# Patient Record
Sex: Female | Born: 1970 | Race: White | Hispanic: No | Marital: Married | Smoking: Never smoker
Health system: Southern US, Community
[De-identification: ages and names within clinical notes are randomized; demographics above are authoritative.]

## PROBLEM LIST (undated history)

## (undated) DIAGNOSIS — F419 Anxiety disorder, unspecified: Secondary | ICD-10-CM

## (undated) DIAGNOSIS — F909 Attention-deficit hyperactivity disorder, unspecified type: Secondary | ICD-10-CM

---

## 2010-09-04 ENCOUNTER — Ambulatory Visit: Payer: Self-pay | Admitting: General Practice

## 2013-10-05 ENCOUNTER — Ambulatory Visit: Payer: Self-pay | Admitting: Medical

## 2013-10-14 ENCOUNTER — Ambulatory Visit: Payer: Self-pay | Admitting: Medical

## 2019-01-19 ENCOUNTER — Ambulatory Visit: Payer: Self-pay | Admitting: Obstetrics and Gynecology

## 2019-02-07 ENCOUNTER — Ambulatory Visit: Payer: Self-pay | Admitting: Obstetrics and Gynecology

## 2019-02-08 ENCOUNTER — Other Ambulatory Visit: Payer: Self-pay | Admitting: Certified Nurse Midwife

## 2019-02-08 DIAGNOSIS — Z1231 Encounter for screening mammogram for malignant neoplasm of breast: Secondary | ICD-10-CM

## 2019-02-22 ENCOUNTER — Ambulatory Visit
Admission: RE | Admit: 2019-02-22 | Discharge: 2019-02-22 | Disposition: A | Discharge status: Home / Self Care | Payer: BC Managed Care – PPO | Source: Ambulatory Visit | Attending: Certified Nurse Midwife | Admitting: Certified Nurse Midwife

## 2019-02-22 ENCOUNTER — Other Ambulatory Visit: Payer: Self-pay

## 2019-02-22 ENCOUNTER — Encounter (INDEPENDENT_AMBULATORY_CARE_PROVIDER_SITE_OTHER): Payer: Self-pay

## 2019-02-22 DIAGNOSIS — Z1231 Encounter for screening mammogram for malignant neoplasm of breast: Secondary | ICD-10-CM | POA: Diagnosis not present

## 2019-04-08 ENCOUNTER — Telehealth: Payer: BC Managed Care – PPO | Admitting: Emergency Medicine

## 2019-04-08 DIAGNOSIS — J029 Acute pharyngitis, unspecified: Secondary | ICD-10-CM | POA: Diagnosis not present

## 2019-04-08 MED ORDER — AMOXICILLIN 500 MG PO CAPS: 500.0000 mg | ORAL_CAPSULE | Freq: Three times a day (TID) | ORAL | 0 refills | Status: DC

## 2019-04-08 NOTE — Progress Notes (Signed)
We are sorry that you are not feeling well.  Here is how we plan to help!  Based on what you have shared with me it is likely that you have strep pharyngitis.  Strep pharyngitis is inflammation and infection in the back of the throat.  This is an infection cause by bacteria and is treated with antibiotics.  I have prescribed Amoxicillin 3 times a day for 10 days. For throat pain, we recommend over the counter oral pain relief medications such as acetaminophen or aspirin, or anti-inflammatory medications such as ibuprofen or naproxen sodium. Topical treatments such as oral throat lozenges or sprays may be used as needed. Strep infections are not as easily transmitted as other respiratory infections, however we still recommend that you avoid close contact with loved ones, especially the very young and elderly.  Remember to wash your hands thoroughly throughout the day as this is the number one way to prevent the spread of infection and wipe down door knobs and counters with disinfectant.   Home Care:  Only take medications as instructed by your medical team.  Complete the entire course of an antibiotic.  Do not take these medications with alcohol.  A steam or ultrasonic humidifier can help congestion.  You can place a towel over your head and breathe in the steam from hot water coming from a faucet.  Avoid close contacts especially the very young and the elderly.  Cover your mouth when you cough or sneeze.  Always remember to wash your hands.  Get Help Right Away If:  You develop worsening fever or sinus pain.  You develop a severe head ache or visual changes.  Your symptoms persist after you have completed your treatment plan.  Make sure you  Understand these instructions.  Will watch your condition.  Will get help right away if you are not doing well or get worse.  Your e-visit answers were reviewed by a board certified advanced clinical practitioner to complete your personal  care plan.  Depending on the condition, your plan could have included both over the counter or prescription medications.  If there is a problem please reply  once you have received a response from your provider.  Your safety is important to [place].  If you have drug allergies check your prescription carefully.    You can use MyChart to ask questions about today's visit, request a non-urgent call back, or ask for a work or school excuse for 24 hours related to this e-Visit. If it has been greater than 24 hours you will need to follow up with your provider, or enter a new e-Visit to address those concerns.  You will get an e-mail in the next two days asking about your experience.  I hope that your e-visit has been valuable and will speed your recovery. Thank you for using e-visits.  Greater than 5 but less than 10 minutes spent researching, coordinating, and implementing care for this patient today

## 2019-04-15 MED ORDER — AMOXICILLIN 500 MG PO CAPS: 500.0000 mg | ORAL_CAPSULE | Freq: Three times a day (TID) | ORAL | 0 refills | Status: DC

## 2019-04-15 NOTE — Addendum Note (Signed)
Addended by: Chevis Pretty on: 04/15/2019 05:44 PM   Modules accepted: Orders

## 2020-06-28 ENCOUNTER — Other Ambulatory Visit: Payer: Self-pay

## 2020-06-28 ENCOUNTER — Ambulatory Visit
Admission: EM | Admit: 2020-06-28 | Discharge: 2020-06-28 | Disposition: A | Discharge status: Home / Self Care | Payer: BC Managed Care – PPO | Attending: Physician Assistant | Admitting: Physician Assistant

## 2020-06-28 ENCOUNTER — Encounter: Payer: Self-pay | Admitting: Emergency Medicine

## 2020-06-28 DIAGNOSIS — B0230 Zoster ocular disease, unspecified: Secondary | ICD-10-CM | POA: Diagnosis not present

## 2020-06-28 MED ORDER — VALACYCLOVIR HCL 1 G PO TABS: 1000.0000 mg | ORAL_TABLET | Freq: Three times a day (TID) | ORAL | 0 refills | Status: AC

## 2020-06-28 NOTE — ED Triage Notes (Signed)
Patient c/o rash on her face that started yesterday. She states the rash tingles but does not itch.

## 2020-06-28 NOTE — Discharge Instructions (Addendum)
The rash on your face is consistent with shingles.  We are catching this early so starting the antiviral medicine should prevent you from having a new rash emerge.  The rash is in a distribution that has the ability to affect the eye.  Your actual eye does not seem affected at this point, but there is a rash of your eyelid.   Go to:  [facility] [address] [place], [place] [phone]

## 2020-06-28 NOTE — ED Provider Notes (Signed)
[facility]    CSN: [id number] Arrival date & time: 06/28/20  2353      History   Chief Complaint Chief Complaint  Patient presents with  . Rash    HPI Tanya Roberson is a 49 y.o. female   presenting for burning and tingling rash affecting the left forehead and upper eyelid that started late yesterday.  Patient says that she has a long small headache in this area and along the left side of her head.  She denies any use of any new products on her face or hair.  Has not taken any medication over-the-counter for symptoms.  Patient states she did have chickenpox as a child.  Patient denies any eye pain or redness.  Denies any drainage from the eye.  No vision changes.  Patient does wear glasses.  Last visit with her eye doctor was about 2 years ago.  She has no other complaints or concerns today.  HPI  History reviewed. No pertinent past medical history.  There are no problems to display for this patient.   History reviewed. No pertinent surgical history.  OB History   No obstetric history on file.      Home Medications    Prior to Admission medications   Medication Sig Start Date End Date Taking? Authorizing Provider  valACYclovir (VALTREX) 1000 MG tablet Take 1 tablet (1,000 mg total) by mouth 3 (three) times daily for 7 days. 06/28/20 07/05/20 Yes Eusebio Friendly B, PA-C  amoxicillin (AMOXIL) 500 MG capsule Take 1 capsule (500 mg total) by mouth 3 (three) times daily. 04/15/19   Bennie Pierini, FNP    Family History Family History  Problem Relation Age of Onset  . Breast cancer Neg Hx     Social History Social History   Tobacco Use  . Smoking status: Never Smoker  . Smokeless tobacco: Never Used  Vaping Use  . Vaping Use: Never used  Substance Use Topics  . Alcohol use: Yes  . Drug use: Never     Allergies   Patient has no known allergies.   Review of Systems Review of Systems  Constitutional: Negative for chills, diaphoresis, fatigue  and fever.  HENT: Negative for congestion, facial swelling, rhinorrhea, sore throat and trouble swallowing.   Eyes: Negative for photophobia, pain, discharge, redness, itching and visual disturbance.  Respiratory: Negative for cough, chest tightness, shortness of breath and wheezing.   Cardiovascular: Negative for chest pain.  Gastrointestinal: Negative for abdominal pain, nausea and vomiting.  Musculoskeletal: Negative for arthralgias and myalgias.  Skin: Positive for rash.  Neurological: Positive for headaches. Negative for dizziness and weakness.  Hematological: Positive for adenopathy.     Physical Exam Triage Vital Signs ED Triage Vitals  Enc Vitals Group     BP 06/28/20 0856 113/79     Pulse Rate 06/28/20 0856 64     Resp 06/28/20 0856 18     Temp 06/28/20 0856 98.5 F (36.9 C)     Temp Source 06/28/20 0856 Oral     SpO2 06/28/20 0856 96 %     Weight 06/28/20 0854 200 lb (90.7 kg)     Height 06/28/20 0854 5\' 4"  (1.626 m)     Head Circumference --      Peak Flow --      Pain Score 06/28/20 0854 0     Pain Loc --      Pain Edu? --      Excl. in GC? --  No data found.  Updated Vital Signs BP 113/79 (BP Location: Right Arm)   Pulse 64   Temp 98.5 F (36.9 C) (Oral)   Resp 18   Ht 5\' 4"  (1.626 m)   Wt 200 lb (90.7 kg)   SpO2 96%   BMI 34.33 kg/m      Physical Exam Vitals and nursing note reviewed.  Constitutional:      General: She is not in acute distress.    Appearance: Normal appearance. She is not ill-appearing or toxic-appearing.  HENT:     Head: Normocephalic and atraumatic.     Nose: Nose normal.     Mouth/Throat:     Mouth: Mucous membranes are moist.     Pharynx: Oropharynx is clear.  Eyes:     General: No scleral icterus.       Right eye: No discharge.        Left eye: No discharge.     Extraocular Movements: Extraocular movements intact.     Conjunctiva/sclera: Conjunctivae normal.     Pupils: Pupils are equal, round, and reactive to  light.  Cardiovascular:     Rate and Rhythm: Normal rate and regular rhythm.  Pulmonary:     Effort: Pulmonary effort is normal. No respiratory distress.  Musculoskeletal:     Cervical back: Neck supple.  Skin:    General: Skin is dry.     Findings: Rash (erythematous vesicular rash affecting V1 distribution, affecting left eyelid. No apparent eye involvement.) present.  Neurological:     General: No focal deficit present.     Mental Status: She is alert. Mental status is at baseline.     Motor: No weakness.     Gait: Gait normal.  Psychiatric:        Mood and Affect: Mood normal.        Behavior: Behavior normal.        Thought Content: Thought content normal.        UC Treatments / Results  Labs (all labs ordered are listed, but only abnormal results are displayed) Labs Reviewed - No data to display  EKG   Radiology No results found.  Procedures Procedures (including critical care time)  Medications Ordered in UC Medications - No data to display  Initial Impression / Assessment and Plan / UC Course  I have reviewed the triage vital signs and the nursing notes.  Pertinent labs & imaging results that were available during my care of the patient were reviewed by me and considered in my medical decision making (see chart for details).   Patient's rash is consistent with shingles.  The distribution affected is the V1 area.  Suspect herpes zoster ophthalmicus without eye involvement at this time.  I discussed this with patient.  I was able to call the [facility] in [place] and get her an appointment.  Patient is going to [facility] at this time.  I had already sent Valtrex to pharmacy for her.  Advised patient to just go to [facility] and they can treat her as they deem appropriate.  Discussed how shingles is spread as well.  ED precautions reviewed.   Final Clinical Impressions(s) / UC Diagnoses   Final diagnoses:  Herpes zoster  ophthalmicus     Discharge Instructions     The rash on your face is consistent with shingles.  We are catching this early so starting the antiviral medicine should prevent you from having a new rash emerge.  The rash is in a distribution that has the ability to affect the eye.  Your actual eye does not seem affected at this point, but there is a rash of your eyelid.  Keep a close eye on this and if you start to develop redness of the eye or pain or any visual changes need to be seen again immediately.    ED Prescriptions    Medication Sig Dispense Auth. Provider   valACYclovir (VALTREX) 1000 MG tablet Take 1 tablet (1,000 mg total) by mouth 3 (three) times daily for 7 days. 21 tablet Shirlee Latch, PA-C     PDMP not reviewed this encounter.   Shirlee Latch, PA-C 06/28/20 [id number]

## 2020-07-03 ENCOUNTER — Telehealth: Payer: BC Managed Care – PPO | Admitting: Nurse Practitioner

## 2020-07-03 DIAGNOSIS — B029 Zoster without complications: Secondary | ICD-10-CM

## 2020-07-03 NOTE — Progress Notes (Signed)
E-visit for Shingles   We are sorry that you are not feeling well. Here is how we plan to help!  Based on what you shared with me it looks like you have shingles.  Shingles or herpes zoster, is a common infection of the nerves.  It is a painful rash caused by the herpes zoster virus.  This is the same virus that causes chickenpox.  After a person has chickenpox, the virus remains inactive in the nerve cells.  Years later, the virus can become active again and travel to the skin.  It typically will appear on one side of the face or body.  Burning or shooting pain, tingling, or itching are early signs of the infection.  Blisters typically scab over in 7 to 10 days and clear up within 2-4 weeks. Shingles is only contagious to people that have never had the chickenpox, the chickenpox vaccine, or anyone who has a compromised immune system.  You should avoid contact with these type of people until your blisters scab over.  It will take time for the rash to completely resolve. But you should not need anymore antivirals.   HOME CARE: . Apply ice packs (wrapped in a thin towel), cool compresses, or soak in cool bath to help reduce pain. . Use calamine lotion to calm itchy skin. . Avoid scratching the rash. . Avoid direct sunlight.  GET HELP RIGHT AWAY IF: . Symptoms that don't away after treatment. . A rash or blisters near your eye. . Increased drainage, fever, or rash after treatment. . Severe pain that doesn't go away.   MAKE SURE YOU    Understand these instructions.  Will watch your condition.  Will get help right away if you are not doing well or get worse.  Thank you for choosing an e-visit. Your e-visit answers were reviewed by a board certified advanced clinical practitioner to complete your personal care plan. Depending upon the condition, your plan could have included both over the counter or prescription medications.  Please review your pharmacy choice. Make sure the pharmacy  is open so you can pick up prescription now. If there is a problem, you may contact your provider through [employer] and have the prescription routed to another pharmacy.  Your safety is important to [place]. If you have drug allergies check your prescription carefully.   For the next 24 hours you can use MyChart to ask questions about today's visit, request a non-urgent call back, or ask for a work or school excuse.  You will get an email in the next two days asking about your experience. I hope that your e-visit has been valuable and will speed your recovery  5-10 minutes spent reviewing and documenting in chart.

## 2021-04-26 IMAGING — MG DIGITAL SCREENING BILATERAL MAMMOGRAM WITH TOMO AND CAD
8 series · 8 of 24 positions shown · non-contrast
Comparison: Previous exam(s).

CLINICAL DATA: Screening.

EXAM:
DIGITAL SCREENING BILATERAL MAMMOGRAM WITH TOMO AND CAD

[L CC synth-2D]
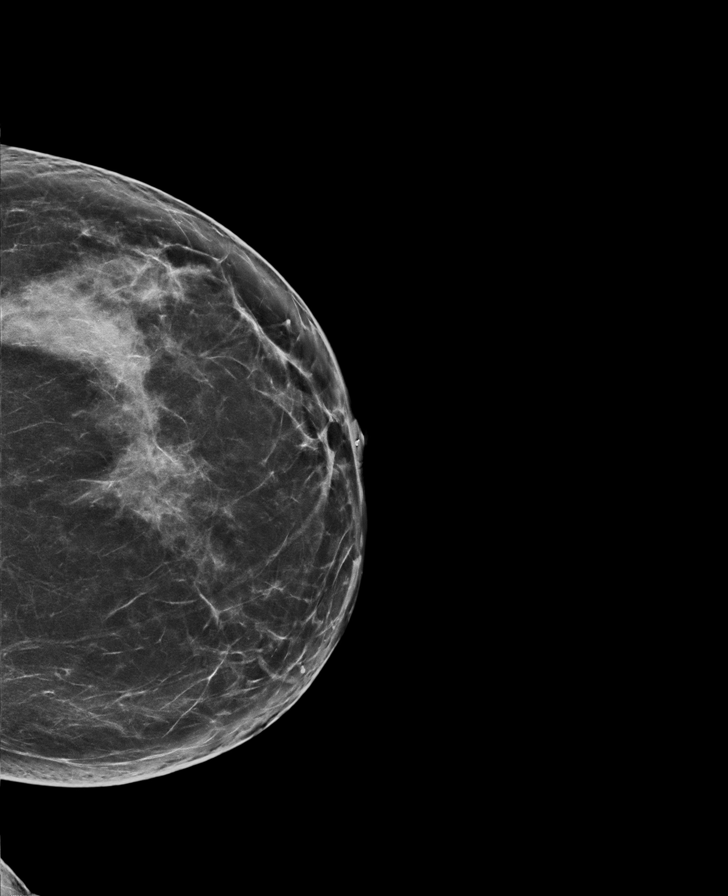

[R CC synth-2D]
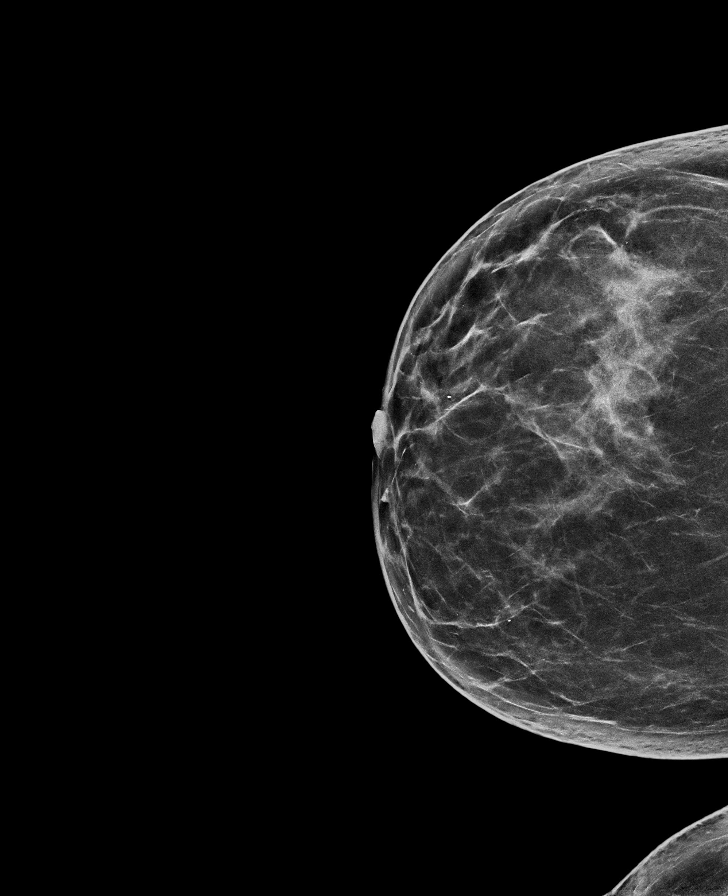

[R MLO synth-2D]
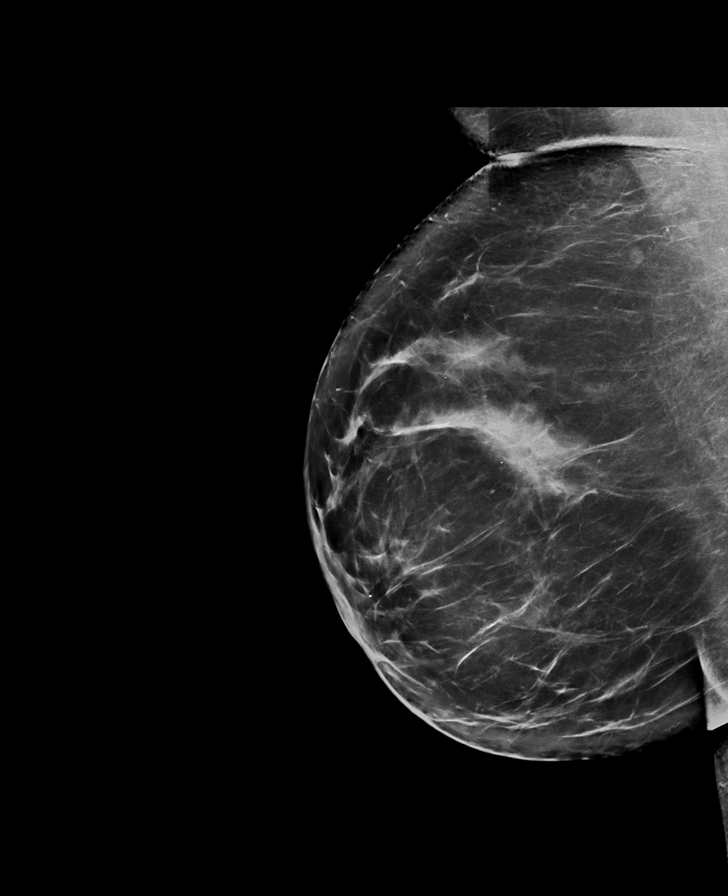

[L MLO synth-2D]
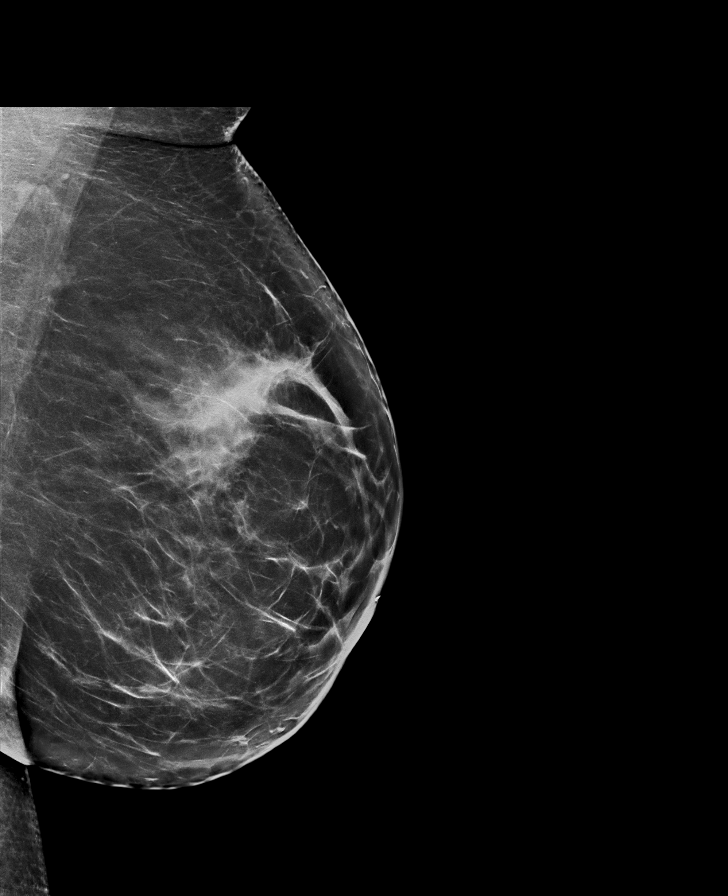

[R MLO tomo · tomo slice 49/97.0]
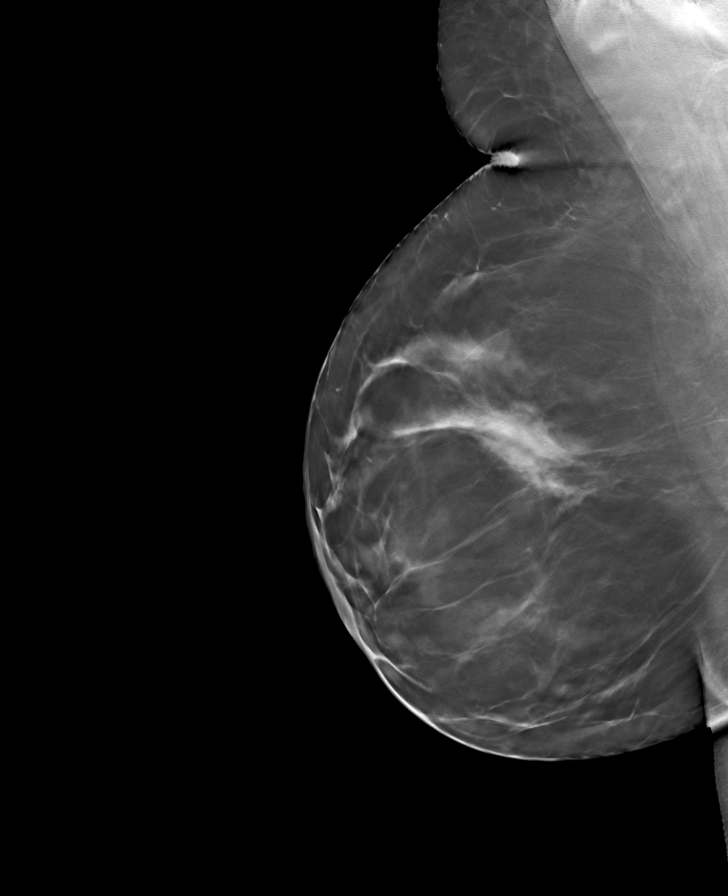

[L CC tomo · tomo slice 41/81.0]
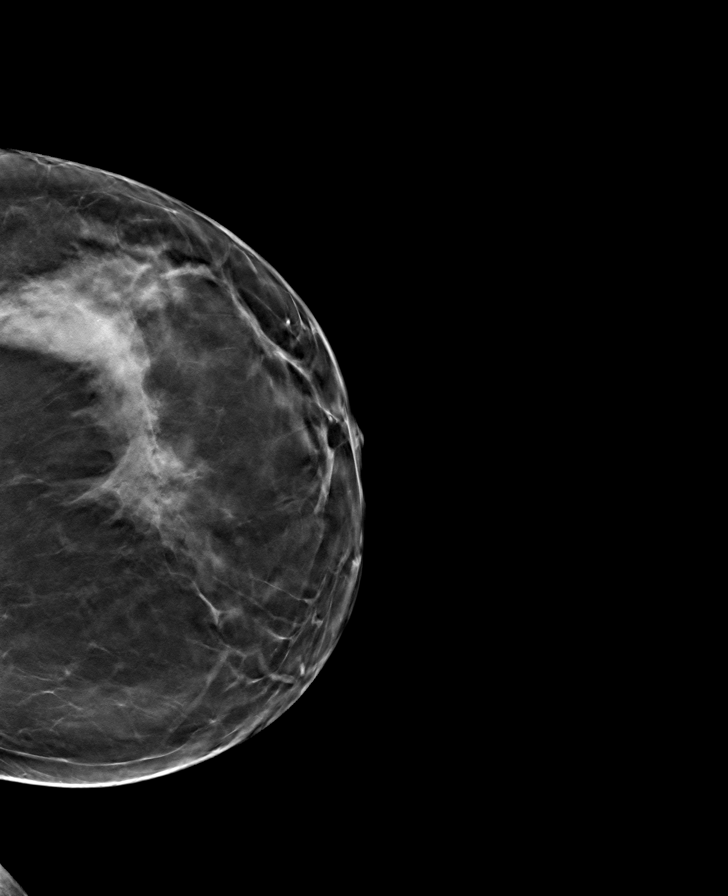

[L MLO tomo · tomo slice 47/92.0]
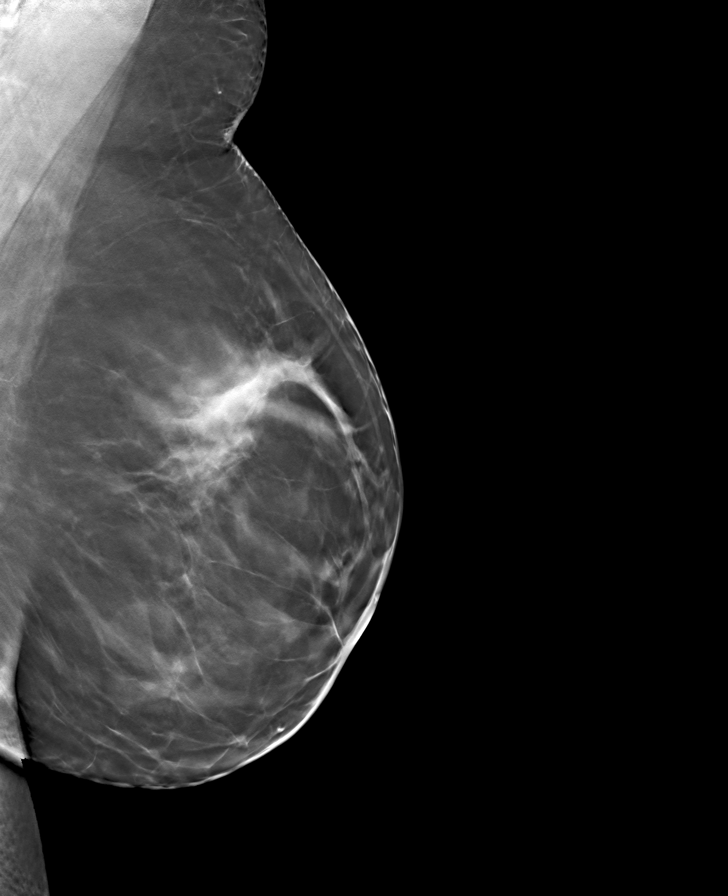

[R CC tomo · tomo slice 41/82.0]
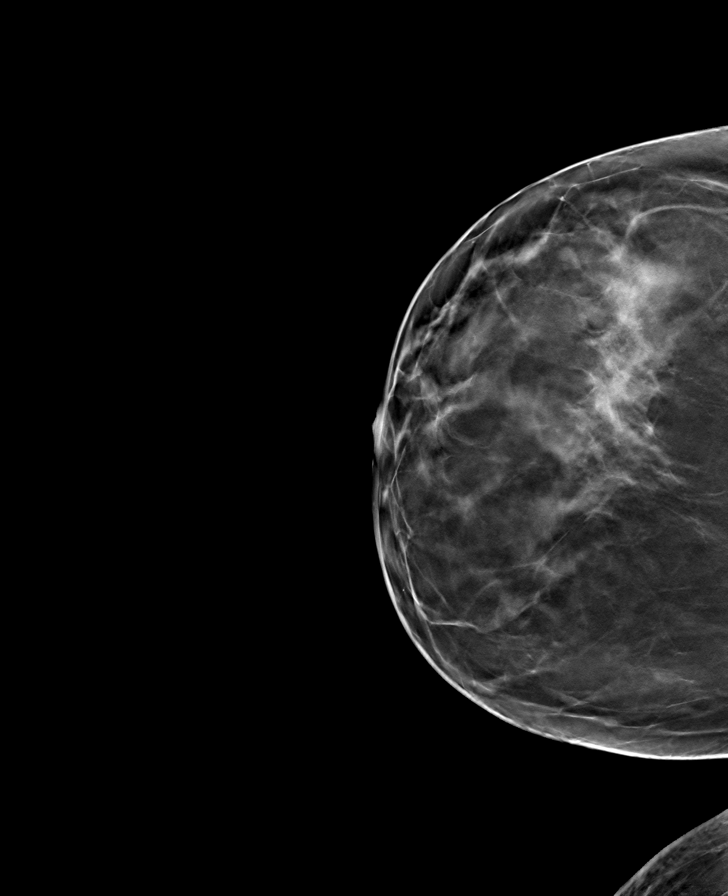

[8 of 24 positions shown; findings below may reference images not displayed]

ACR Breast Density Category b: There are scattered areas of
fibroglandular density.
FINDINGS: There are no findings suspicious for malignancy. Images were
processed with CAD.
IMPRESSION: No mammographic evidence of malignancy. A result letter of this
screening mammogram will be mailed directly to the patient.

RECOMMENDATION:
Screening mammogram in one year. (Code:[id number])

BI-RADS CATEGORY  1: Negative.

## 2021-10-29 ENCOUNTER — Other Ambulatory Visit: Payer: Self-pay | Admitting: Family Medicine

## 2021-10-29 DIAGNOSIS — Z1231 Encounter for screening mammogram for malignant neoplasm of breast: Secondary | ICD-10-CM

## 2021-12-12 ENCOUNTER — Ambulatory Visit: Payer: BC Managed Care – PPO

## 2022-01-06 ENCOUNTER — Ambulatory Visit
Admission: RE | Admit: 2022-01-06 | Discharge: 2022-01-06 | Disposition: A | Discharge status: Home / Self Care | Payer: BC Managed Care – PPO | Source: Ambulatory Visit | Attending: Family Medicine | Admitting: Family Medicine

## 2022-01-06 DIAGNOSIS — Z1231 Encounter for screening mammogram for malignant neoplasm of breast: Secondary | ICD-10-CM | POA: Diagnosis present

## 2022-05-27 ENCOUNTER — Telehealth: Payer: BC Managed Care – PPO | Admitting: Physician Assistant

## 2022-05-27 DIAGNOSIS — U071 COVID-19: Secondary | ICD-10-CM

## 2022-05-27 MED ORDER — NIRMATRELVIR/RITONAVIR (PAXLOVID)TABLET: 3.0000 | ORAL_TABLET | Freq: Two times a day (BID) | ORAL | 0 refills | Status: AC

## 2022-05-27 NOTE — Progress Notes (Signed)
Virtual Visit Consent   Tanya Roberson, you are scheduled for a virtual visit with a [facility] provider today. Just as with appointments in the office, your consent must be obtained to participate. Your consent will be active for this visit and any virtual visit you may have with one of our providers in the next 365 days. If you have a MyChart account, a copy of this consent can be sent to you electronically.  As this is a virtual visit, video technology does not allow for your provider to perform a traditional examination. This may limit your provider's ability to fully assess your condition. If your provider identifies any concerns that need to be evaluated in person or the need to arrange testing (such as labs, EKG, etc.), we will make arrangements to do so. Although advances in technology are sophisticated, we cannot ensure that it will always work on either your end or our end. If the connection with a video visit is poor, the visit may have to be switched to a telephone visit. With either a video or telephone visit, we are not always able to ensure that we have a secure connection.  By engaging in this virtual visit, you consent to the provision of healthcare and authorize for your insurance to be billed (if applicable) for the services provided during this visit. Depending on your insurance coverage, you may receive a charge related to this service.  I need to obtain your verbal consent now. Are you willing to proceed with your visit today? Tanya Roberson has provided verbal consent on 05/27/2022 for a virtual visit (video or telephone). Tanya Roberson, [place]  Date: 05/27/2022 9:24 AM  Virtual Visit via Video Note   I, Tanya Roberson, connected with  Tanya Roberson  ([id number], 29-Apr-1971) on 05/27/22 at  9:15 AM EST by a video-enabled telemedicine application and verified that I am speaking with the correct person using two identifiers.  Location: Patient: Virtual Visit Location  Patient: Home Provider: Virtual Visit Location Provider: Home Office   I discussed the limitations of evaluation and management by telemedicine and the availability of in person appointments. The patient expressed understanding and agreed to proceed.    History of Present Illness: Tanya Roberson is a 51 y.o. who identifies as a female who was assigned female at birth, and is being seen today for COVID-19. Endorses symptoms starting 2 days ago with nasal and head congestion, cough and fatigue. Yesterday noted fever with Tmax of 103, but has seemed to resolve as of this morning. Denies chest pain, SOB or GI symptoms. Tested positive for COVID this morning. Has not had COVID before that she is aware of.   HPI: HPI  Problems: There are no problems to display for this patient.   Allergies: No Known Allergies Medications:  Current Outpatient Medications:    cetirizine (ZYRTEC) 10 MG chewable tablet, Chew 10 mg by mouth daily., Disp: , Rfl:    VYVANSE 20 MG capsule, Take by mouth., Disp: , Rfl:   Observations/Objective: Patient is well-developed, well-nourished in no acute distress.  Resting comfortably at home.  Head is normocephalic, atraumatic.  No labored breathing. Speech is clear and coherent with logical content.  Patient is alert and oriented at baseline.   Assessment and Plan: 1. COVID-19  Discussed risks/benefits of antiviral medications including most common potential ADRs. Patient voiced understanding and would like to proceed with antiviral medication. They are candidate for Paxlovid. Rx sent to pharmacy. Supportive measures,  OTC medications and vitamin regimen reviewed. Patient has been enrolled in a MyChart COVID symptom monitoring program. Anne Shutter reviewed in detail. Strict ER precautions discussed with patient.    Follow Up Instructions: I discussed the assessment and treatment plan with the patient. The patient was provided an opportunity to ask questions and all were  answered. The patient agreed with the plan and demonstrated an understanding of the instructions.  A copy of instructions were sent to the patient via MyChart unless otherwise noted below.   The patient was advised to call back or seek an in-person evaluation if the symptoms worsen or if the condition fails to improve as anticipated.  Time:  I spent 10 minutes with the patient via telehealth technology discussing the above problems/concerns.    Tanya Climes, PA-C

## 2022-05-27 NOTE — Patient Instructions (Signed)
Tanya RenshawEvan A Roberson, thank you for joining Piedad ClimesWilliam Cody Taletha Twiford, PA-C for today's virtual visit.  While this provider is not your primary care provider (PCP), if your PCP is located in our provider database this encounter information will be shared with them immediately following your visit.   A [facility] MyChart account gives you access to today's visit and all your visits, tests, and labs performed at [facility] " click here if you don't have a [facility] MyChart account or go to [email or web address]  Consent: (Patient) Tanya RenshawEvan A Roberson provided verbal consent for this virtual visit at the beginning of the encounter.  Current Medications:  Current Outpatient Medications:    cetirizine (ZYRTEC) 10 MG chewable tablet, Chew 10 mg by mouth daily., Disp: , Rfl:    VYVANSE 20 MG capsule, Take by mouth., Disp: , Rfl:    Medications ordered in this encounter:  No orders of the defined types were placed in this encounter.    *If you need refills on other medications prior to your next appointment, please contact your pharmacy*  Follow-Up: Call back or seek an in-person evaluation if the symptoms worsen or if the condition fails to improve as anticipated.  [facility] [phone]  Other Instructions Please keep well-hydrated and get plenty of rest. Start a saline nasal rinse to flush out your nasal passages. You can use plain Mucinex to help thin congestion. If you have a humidifier, running in the bedroom at night. I want you to start OTC vitamin D3 1000 units daily, vitamin C 1000 mg daily, and a zinc supplement. Please take prescribed medications as directed.  You have been enrolled in a MyChart symptom monitoring program. Please answer these questions daily so we can keep track of how you are doing.  You were to quarantine for 5 days from onset of your symptoms.  After day 5, if you have had no fever and you are feeling better, you can end quarantine but need  to mask for an additional 5 days. After day 5 if you have a fever or are having significant symptoms, please quarantine for full 10 days.  If you note any worsening of symptoms, any significant shortness of breath or any chest pain, please seek ER evaluation ASAP.  Please do not delay care!  COVID-19: What to Do if You Are Sick If you test positive and are an older adult or someone who is at high risk of getting very sick from COVID-19, treatment may be available. Contact a healthcare provider right away after a positive test to determine if you are eligible, even if your symptoms are mild right now. You can also visit a Test to Treat location and, if eligible, receive a prescription from a provider. Don't delay: Treatment must be started within the first few days to be effective. If you have a fever, cough, or other symptoms, you might have COVID-19. Most people have mild illness and are able to recover at home. If you are sick: Keep track of your symptoms. If you have an emergency warning sign (including trouble breathing), call 911. Steps to help prevent the spread of COVID-19 if you are sick If you are sick with COVID-19 or think you might have COVID-19, follow the steps below to care for yourself and to help protect other people in your home and community. Stay home except to get medical care Stay home. Most people with COVID-19 have mild illness and can recover at home without medical care. Do  not leave your home, except to get medical care. Do not visit public areas and do not go to places where you are unable to wear a mask. Take care of yourself. Get rest and stay hydrated. Take over-the-counter medicines, such as acetaminophen, to help you feel better. Stay in touch with your doctor. Call before you get medical care. Be sure to get care if you have trouble breathing, or have any other emergency warning signs, or if you think it is an emergency. Avoid public transportation, ride-sharing, or  taxis if possible. Get tested If you have symptoms of COVID-19, get tested. While waiting for test results, stay away from others, including staying apart from those living in your household. Get tested as soon as possible after your symptoms start. Treatments may be available for people with COVID-19 who are at risk for becoming very sick. Don't delay: Treatment must be started early to be effective--some treatments must begin within 5 days of your first symptoms. Contact your healthcare provider right away if your test result is positive to determine if you are eligible. Self-tests are one of several options for testing for the virus that causes COVID-19 and may be more convenient than laboratory-based tests and point-of-care tests. Ask your healthcare provider or your local health department if you need help interpreting your test results. You can visit your state, tribal, local, and territorial health department's website to look for the latest local information on testing sites. Separate yourself from other people As much as possible, stay in a specific room and away from other people and pets in your home. If possible, you should use a separate bathroom. If you need to be around other people or animals in or outside of the home, wear a well-fitting mask. Tell your close contacts that they may have been exposed to COVID-19. An infected person can spread COVID-19 starting 48 hours (or 2 days) before the person has any symptoms or tests positive. By letting your close contacts know they may have been exposed to COVID-19, you are helping to protect everyone. See COVID-19 and Animals if you have questions about pets. If you are diagnosed with COVID-19, someone from the health department may call you. Answer the call to slow the spread. Monitor your symptoms Symptoms of COVID-19 include fever, cough, or other symptoms. Follow care instructions from your healthcare provider and local health department.  Your local health authorities may give instructions on checking your symptoms and reporting information. When to seek emergency medical attention Look for emergency warning signs* for COVID-19. If someone is showing any of these signs, seek emergency medical care immediately: Trouble breathing Persistent pain or pressure in the chest New confusion Inability to wake or stay awake Pale, gray, or blue-colored skin, lips, or nail beds, depending on skin tone *This list is not all possible symptoms. Please call your medical provider for any other symptoms that are severe or concerning to you. Call 911 or call ahead to your local emergency facility: Notify the operator that you are seeking care for someone who has or may have COVID-19. Call ahead before visiting your doctor Call ahead. Many medical visits for routine care are being postponed or done by phone or telemedicine. If you have a medical appointment that cannot be postponed, call your doctor's office, and tell them you have or may have COVID-19. This will help the office protect themselves and other patients. If you are sick, wear a well-fitting mask You should wear a mask if you must  be around other people or animals, including pets (even at home). Wear a mask with the best fit, protection, and comfort for you. You don't need to wear the mask if you are alone. If you can't put on a mask (because of trouble breathing, for example), cover your coughs and sneezes in some other way. Try to stay at least 6 feet away from other people. This will help protect the people around you. Masks should not be placed on young children under age 74 years, anyone who has trouble breathing, or anyone who is not able to remove the mask without help. Cover your coughs and sneezes Cover your mouth and nose with a tissue when you cough or sneeze. Throw away used tissues in a lined trash can. Immediately wash your hands with soap and water for at least 20 seconds.  If soap and water are not available, clean your hands with an alcohol-based hand sanitizer that contains at least 60% alcohol. Clean your hands often Wash your hands often with soap and water for at least 20 seconds. This is especially important after blowing your nose, coughing, or sneezing; going to the bathroom; and before eating or preparing food. Use hand sanitizer if soap and water are not available. Use an alcohol-based hand sanitizer with at least 60% alcohol, covering all surfaces of your hands and rubbing them together until they feel dry. Soap and water are the best option, especially if hands are visibly dirty. Avoid touching your eyes, nose, and mouth with unwashed hands. Handwashing Tips Avoid sharing personal household items Do not share dishes, drinking glasses, cups, eating utensils, towels, or bedding with other people in your home. Wash these items thoroughly after using them with soap and water or put in the dishwasher. Clean surfaces in your home regularly Clean and disinfect high-touch surfaces (for example, doorknobs, tables, handles, light switches, and countertops) in your "sick room" and bathroom. In shared spaces, you should clean and disinfect surfaces and items after each use by the person who is ill. If you are sick and cannot clean, a caregiver or other person should only clean and disinfect the area around you (such as your bedroom and bathroom) on an as needed basis. Your caregiver/other person should wait as long as possible (at least several hours) and wear a mask before entering, cleaning, and disinfecting shared spaces that you use. Clean and disinfect areas that may have blood, stool, or body fluids on them. Use household cleaners and disinfectants. Clean visible dirty surfaces with household cleaners containing soap or detergent. Then, use a household disinfectant. Use a product from [employer] List N: Disinfectants for Coronavirus (COVID-19). Be sure to follow the  instructions on the label to ensure safe and effective use of the product. Many products recommend keeping the surface wet with a disinfectant for a certain period of time (look at "contact time" on the product label). You may also need to wear personal protective equipment, such as gloves, depending on the directions on the product label. Immediately after disinfecting, wash your hands with soap and water for 20 seconds. For completed guidance on cleaning and disinfecting your home, visit Complete Disinfection Guidance. Take steps to improve ventilation at home Improve ventilation (air flow) at home to help prevent from spreading COVID-19 to other people in your household. Clear out COVID-19 virus particles in the air by opening windows, using air filters, and turning on fans in your home. Use this interactive tool to learn how to improve air flow in  your home. When you can be around others after being sick with COVID-19 Deciding when you can be around others is different for different situations. Find out when you can safely end home isolation. For any additional questions about your care, contact your healthcare provider or state or local health department. 09/18/2020 Content source: [facility] ([facility]), Division of Viral Diseases This information is not intended to replace advice given to you by your health care provider. Make sure you discuss any questions you have with your health care provider. Document Revised: 11/01/2020 Document Reviewed: 11/01/2020 Elsevier Patient Education  2022 [employer].      If you have been instructed to have an in-person evaluation today at a local Urgent Care facility, please use the link below. It will take you to a list of all of our available [facility], including address, phone number and hours of operation. Please do not delay care.  [facility]  If you or a family member do  not have a primary care provider, use the link below to schedule a visit and establish care. When you choose a [facility] primary care physician or advanced practice provider, you gain a long-term partner in health. Find a Primary Care Provider  Learn more about [facility] in-office and virtual care options: [facility] - Get Care Now

## 2022-05-29 ENCOUNTER — Telehealth: Payer: Self-pay

## 2022-05-29 NOTE — Telephone Encounter (Signed)
Called and spoke to patient in regards to COVID survey for symptoms of diarrhea. Patient states that she is feeling better. Advised patient from my chart companion. Patient verb understanding.  If diarrhea remains the same:  encourage patient to drink oral fluids and bland foods.  Avoid alcohol, spicy foods, caffeine or fatty foods that could make diarrhea worse.  Continue to monitor for signs of dehydration (increased thirst decreased urine output, yellow urine, dry skin, headache or dizziness).  Advise patient to try OTC medication (Imodium, kaopectate, Pepto-Bismol) as per manufacturer's instructions. If worsening diarrhea occurs and becomes severe (6-7 bowel movements a day): notify PCP  If diarrhea last greater than 7 days: notify PCP If signs of dehydration occur (increased thirst, decreased urine output, yellow urine, dry skin, headache or dizziness) advise patient to call 911 and seek treatment in the ED

## 2023-01-08 ENCOUNTER — Other Ambulatory Visit: Payer: Self-pay | Admitting: Family Medicine

## 2023-01-08 DIAGNOSIS — Z1231 Encounter for screening mammogram for malignant neoplasm of breast: Secondary | ICD-10-CM

## 2023-03-09 ENCOUNTER — Ambulatory Visit
Admission: RE | Admit: 2023-03-09 | Discharge: 2023-03-09 | Disposition: A | Discharge status: Home / Self Care | Payer: BC Managed Care – PPO | Source: Ambulatory Visit | Attending: Family Medicine | Admitting: Family Medicine

## 2023-03-09 DIAGNOSIS — Z1231 Encounter for screening mammogram for malignant neoplasm of breast: Secondary | ICD-10-CM | POA: Insufficient documentation

## 2023-09-04 ENCOUNTER — Encounter: Payer: Self-pay | Admitting: Adult Health

## 2023-09-04 ENCOUNTER — Ambulatory Visit: Payer: Self-pay | Admitting: Adult Health

## 2023-09-04 ENCOUNTER — Other Ambulatory Visit: Payer: Self-pay

## 2023-09-04 VITALS — BP 132/67 | HR 92 | Temp 97.2°F | Ht 64.0 in | Wt 200.0 lb

## 2023-09-04 DIAGNOSIS — S61215A Laceration without foreign body of left ring finger without damage to nail, initial encounter: Secondary | ICD-10-CM

## 2023-09-04 NOTE — Progress Notes (Signed)
 [facility] [address]  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  Pulled together and dermabonded, covered with steristrips and bandage.Tetanus Is up to date   Diagnosis  1. Laceration of left ring finger, foreign body presence unspecified, nail damage status unspecified, initial encounter (Primary) Keep clean and dry, follow up for any signs or symptoms of infection.      Medications Prescribed  No orders of the defined types were placed in this encounter.   Referred to no referral    Return to Work Status Return to work today.    Provider Signature ________________________________________Date_________   Employee Signature _______________________________________Date_________   Please email this completed form to Angus Seller, Director of Risk Management at [email or web address] within 24 hours of visit.

## 2024-02-17 ENCOUNTER — Ambulatory Visit: Payer: BC Managed Care – PPO | Admitting: Dermatology

## 2024-02-22 ENCOUNTER — Ambulatory Visit: Admitting: Dermatology

## 2024-07-27 ENCOUNTER — Other Ambulatory Visit: Payer: Self-pay | Admitting: Family Medicine

## 2024-07-27 DIAGNOSIS — Z1231 Encounter for screening mammogram for malignant neoplasm of breast: Secondary | ICD-10-CM

## 2024-07-28 ENCOUNTER — Encounter: Payer: Self-pay | Admitting: Gastroenterology

## 2024-07-29 ENCOUNTER — Other Ambulatory Visit: Payer: Self-pay

## 2024-07-29 ENCOUNTER — Encounter: Payer: Self-pay | Admitting: Gastroenterology

## 2024-07-29 ENCOUNTER — Ambulatory Visit: Payer: Self-pay | Admitting: Anesthesiology

## 2024-07-29 ENCOUNTER — Encounter
Admission: RE | Disposition: A | Discharge status: Home / Self Care | Payer: Self-pay | Source: Home / Self Care | Attending: Gastroenterology

## 2024-07-29 ENCOUNTER — Ambulatory Visit
Admission: RE | Admit: 2024-07-29 | Discharge: 2024-07-29 | Disposition: A | Discharge status: Home / Self Care | Attending: Gastroenterology | Admitting: Gastroenterology

## 2024-07-29 DIAGNOSIS — Z1211 Encounter for screening for malignant neoplasm of colon: Secondary | ICD-10-CM | POA: Insufficient documentation

## 2024-07-29 DIAGNOSIS — D122 Benign neoplasm of ascending colon: Secondary | ICD-10-CM | POA: Insufficient documentation

## 2024-07-29 HISTORY — DX: Anxiety disorder, unspecified: F41.9

## 2024-07-29 HISTORY — DX: Attention-deficit hyperactivity disorder, unspecified type: F90.9

## 2024-07-29 MED ORDER — SODIUM CHLORIDE 0.9 % IV SOLN: INTRAVENOUS | Status: DC

## 2024-07-29 MED ORDER — PROPOFOL 500 MG/50ML IV EMUL
INTRAVENOUS | Status: DC | PRN
  Administered 2024-07-29: 140 ug/kg/min via INTRAVENOUS
  Administered 2024-07-29: 80 mg via INTRAVENOUS

## 2024-07-29 MED ORDER — LIDOCAINE HCL (CARDIAC) PF 100 MG/5ML IV SOSY
PREFILLED_SYRINGE | INTRAVENOUS | Status: DC | PRN
  Administered 2024-07-29: 100 mg via INTRAVENOUS

## 2024-07-29 NOTE — Anesthesia Preprocedure Evaluation (Signed)
"                                    Anesthesia Evaluation  Patient identified by MRN, date of birth, ID band Patient awake    Reviewed: Allergy & Precautions, H&P , NPO status , Patient's Chart, lab work & pertinent test results  Airway Mallampati: II  TM Distance: >3 FB Neck ROM: Full    Dental no notable dental hx. (+) Teeth Intact   Pulmonary neg pulmonary ROS   Pulmonary exam normal breath sounds clear to auscultation       Cardiovascular negative cardio ROS Normal cardiovascular exam Rhythm:Regular Rate:Normal     Neuro/Psych negative neurological ROS  negative psych ROS   GI/Hepatic negative GI ROS, Neg liver ROS,,,  Endo/Other  negative endocrine ROS    Renal/GU negative Renal ROS  negative genitourinary   Musculoskeletal negative musculoskeletal ROS (+)    Abdominal   Peds negative pediatric ROS (+)  Hematology negative hematology ROS (+)   Anesthesia Other Findings   Reproductive/Obstetrics negative OB ROS                              Anesthesia Physical Anesthesia Plan  ASA: 2  Anesthesia Plan: General   Post-op Pain Management:    Induction: Intravenous  PONV Risk Score and Plan:   Airway Management Planned:   Additional Equipment:   Intra-op Plan:   Post-operative Plan: Extubation in OR  Informed Consent: I have reviewed the patients History and Physical, chart, labs and discussed the procedure including the risks, benefits and alternatives for the proposed anesthesia with the patient or authorized representative who has indicated his/her understanding and acceptance.     Dental advisory given  Plan Discussed with: CRNA  Anesthesia Plan Comments:         Anesthesia Quick Evaluation  "

## 2024-07-29 NOTE — Anesthesia Postprocedure Evaluation (Signed)
"   Anesthesia Post Note  Patient: Tanya Roberson  Procedure(s) Performed: COLONOSCOPY POLYPECTOMY, INTESTINE  Patient location during evaluation: PACU Anesthesia Type: General Level of consciousness: awake and alert Pain management: pain level controlled Vital Signs Assessment: post-procedure vital signs reviewed and stable Respiratory status: spontaneous breathing, nonlabored ventilation, respiratory function stable and patient connected to nasal cannula oxygen Cardiovascular status: blood pressure returned to baseline and stable Postop Assessment: no apparent nausea or vomiting Anesthetic complications: no   No notable events documented.   Last Vitals:  Vitals:   07/29/24 1119 07/29/24 1130  BP: (!) 86/75 130/75  Pulse: 76 65  Resp: 18 17  Temp:    SpO2: 100% 100%    Last Pain:  Vitals:   07/29/24 1130  TempSrc:   PainSc: 0-No pain                 Fairy A Dorla Guizar      "

## 2024-07-29 NOTE — Transfer of Care (Signed)
 Immediate Anesthesia Transfer of Care Note  Patient: Tanya Roberson  Procedure(s) Performed: COLONOSCOPY POLYPECTOMY, INTESTINE  Patient Location: PACU  Anesthesia Type:MAC  Level of Consciousness: awake, alert , and oriented  Airway & Oxygen Therapy: Patient Spontanous Breathing  Post-op Assessment: Report given to RN and Post -op Vital signs reviewed and stable  Post vital signs: stable  Last Vitals:  Vitals Value Taken Time  BP 94/58 07/29/24 11:10  Temp    Pulse 73 07/29/24 11:10  Resp 14 07/29/24 11:10  SpO2 100 % 07/29/24 11:10  Vitals shown include unfiled device data.  Last Pain:  Vitals:   07/29/24 1109  TempSrc:   PainSc: 0-No pain         Complications: No notable events documented.

## 2024-07-29 NOTE — Op Note (Signed)
 [facility] Patient Name: Tanya Roberson Procedure Date: 07/29/2024 10:46 AM MRN: [id number] Account #: [id number] Date of Birth: November 30, 1970 Admit Type: Outpatient Age: 54 Room: [facility] ENDO ROOM 4 Gender: Female Note Status: Finalized Instrument Name: Colon Scope [id number] Procedure:             Colonoscopy Indications:           Screening for colorectal malignant neoplasm Providers:             Ruel Kung MD, MD Referring MD:          Ruel Kung MD, MD (Referring MD), Gaetana CROME. Fields                         (Referring MD) Medicines:             Monitored Anesthesia Care Complications:         No immediate complications. Procedure:             Pre-Anesthesia Assessment:                        - Prior to the procedure, a History and Physical was                         performed, and patient medications, allergies and                         sensitivities were reviewed. The patient's tolerance                         of previous anesthesia was reviewed.                        - The risks and benefits of the procedure and the                         sedation options and risks were discussed with the                         patient. All questions were answered and informed                         consent was obtained.                        - ASA Grade Assessment: II - A patient with mild                         systemic disease.                        After obtaining informed consent, the colonoscope was                         passed under direct vision. Throughout the procedure,                         the patient's blood pressure, pulse, and oxygen                         saturations  were monitored continuously. The                         Colonoscope was introduced through the and advanced to                         the the cecum, identified by the appendiceal orifice.                         The colonoscopy was performed with ease. The patient                          tolerated the procedure well. The quality of the bowel                         preparation was excellent. The ileocecal valve,                         appendiceal orifice, and rectum were photographed. Findings:      The perianal and digital rectal examinations were normal.      A 5 mm polyp was found in the ascending colon. The polyp was sessile.       The polyp was removed with a cold snare. Resection and retrieval were       complete.      The exam was otherwise without abnormality on direct and retroflexion       views. Impression:            - One 5 mm polyp in the ascending colon, removed with                         a cold snare. Resected and retrieved.                        - The examination was otherwise normal on direct and                         retroflexion views. Recommendation:        - Discharge patient to home (with escort).                        - Resume previous diet.                        - Continue present medications.                        - Await pathology results.                        - Repeat colonoscopy for surveillance based on                         pathology results. Procedure Code(s):     --- Professional ---                        [phone], Colonoscopy, flexible; with removal of                         tumor(s),  polyp(s), or other lesion(s) by snare                         technique Diagnosis Code(s):     --- Professional ---                        Z12.11, Encounter for screening for malignant neoplasm                         of colon                        D12.2, Benign neoplasm of ascending colon CPT copyright 2022 American Medical Association. All rights reserved. The codes documented in this report are preliminary and upon coder review may  be revised to meet current compliance requirements. Ruel Kung, MD Ruel Kung MD, MD 07/29/2024 11:05:45 AM This report has been signed electronically. Number of Addenda: 0 Note Initiated  On: 07/29/2024 10:46 AM Scope Withdrawal Time: 0 hours 9 minutes 48 seconds  Total Procedure Duration: 0 hours 11 minutes 39 seconds  Estimated Blood Loss:  Estimated blood loss: none.      [facility]

## 2024-07-29 NOTE — H&P (Signed)
 "                                                                                                                           Ruel Kung , MD [address], [address], [place], [place], [address] Phone: [phone] Fax: [phone]  Primary Care Physician:  Harvey Gaetana CROME, NP   Pre-Procedure History & Physical: HPI:  Tanya Roberson is a 54 y.o. female is here for an colonoscopy.   Past Medical History:  Diagnosis Date   ADHD    Anxiety     No past surgical history on file.  Prior to Admission medications  Medication Sig Start Date End Date Taking? Authorizing Provider  cetirizine (ZYRTEC) 10 MG chewable tablet Chew 10 mg by mouth daily.    [provider]  escitalopram (LEXAPRO) 10 MG tablet Take 2.5 mg by mouth daily.    [provider]  VYVANSE 20 MG capsule Take 20 mg by mouth daily. 04/24/22   [provider]    Allergies as of 07/04/2024   (No Known Allergies)    Family History  Problem Relation Age of Onset   Breast cancer Neg Hx     Social History   Socioeconomic History   Marital status: Married    Spouse name: Not on file   Number of children: Not on file   Years of education: Not on file   Highest education level: Not on file  Occupational History   Not on file  Tobacco Use   Smoking status: Never   Smokeless tobacco: Never  Vaping Use   Vaping status: Never Used  Substance and Sexual Activity   Alcohol use: Yes   Drug use: Never   Sexual activity: Not on file  Other Topics Concern   Not on file  Social History Narrative   Not on file   Social Drivers of Health   Tobacco Use: Low Risk  (07/27/2024)   Received from [facility]   Patient History    Smoking Tobacco Use: Never    Smokeless Tobacco Use: Never    Passive Exposure: Not on file  Financial Resource Strain: Low Risk  (07/27/2024)   Received from [facility]   Overall Financial Resource Strain (CARDIA)    Difficulty of  Paying Living Expenses: Not hard at all  Food Insecurity: No Food Insecurity (07/27/2024)   Received from [facility]   Epic    Within the past 12 months, you worried that your food would run out before you got the money to buy more.: Never true    Within the past 12 months, the food you bought just didn't last and you didn't have money to get more.: Never true  Transportation Needs: No Transportation Needs (07/27/2024)   Received from [facility] - Transportation    In the past 12 months, has lack of transportation kept you from medical  appointments or from getting medications?: No    Lack of Transportation (Non-Medical): No  Physical Activity: Not on file  Stress: Not on file  Social Connections: Not on file  Intimate Partner Violence: Not on file  Depression (EYV7-0): Not on file  Alcohol Screen: Not on file  Housing: Low Risk  (07/27/2024)   Received from [facility]   Epic    In the last 12 months, was there a time when you were not able to pay the mortgage or rent on time?: No    In the past 12 months, how many times have you moved where you were living?: 0    At any time in the past 12 months, were you homeless or living in a shelter (including now)?: No  Utilities: Not At Risk (07/27/2024)   Received from [facility]   Epic    In the past 12 months has the electric, gas, oil, or water company threatened to shut off services in your home?: No  Health Literacy: Not on file    Review of Systems: See HPI, otherwise negative ROS  Physical Exam: There were no vitals taken for this visit. General:   Alert,  pleasant and cooperative in NAD Head:  Normocephalic and atraumatic. Neck:  Supple; no masses or thyromegaly. Lungs:  Clear throughout to auscultation, normal respiratory effort.    Heart:  +S1, +S2, Regular rate and rhythm, No edema. Abdomen:  Soft, nontender and nondistended. Normal bowel  sounds, without guarding, and without rebound.   Neurologic:  Alert and  oriented x4;  grossly normal neurologically.  Impression/Plan: Tanya Roberson is here for an colonoscopy to be performed for Screening colonoscopy average risk   Risks, benefits, limitations, and alternatives regarding  colonoscopy have been reviewed with the patient.  Questions have been answered.  All parties agreeable.   Ruel Kung, MD  07/29/2024, 9:58 AM  "

## 2024-08-01 LAB — SURGICAL PATHOLOGY

## 2024-08-02 ENCOUNTER — Ambulatory Visit

## 2024-08-03 ENCOUNTER — Ambulatory Visit: Payer: Self-pay | Admitting: Gastroenterology

## 2024-08-11 ENCOUNTER — Ambulatory Visit
